# Patient Record
Sex: Female | Born: 2000 | Race: White | Hispanic: No | Marital: Single | Smoking: Never smoker
Health system: Southern US, Community
[De-identification: ages and names within clinical notes are randomized; demographics above are authoritative.]

## PROBLEM LIST (undated history)

## (undated) DIAGNOSIS — R569 Unspecified convulsions: Secondary | ICD-10-CM

## (undated) DIAGNOSIS — G43909 Migraine, unspecified, not intractable, without status migrainosus: Secondary | ICD-10-CM

---
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
## 2021-08-24 ENCOUNTER — Encounter (HOSPITAL_BASED_OUTPATIENT_CLINIC_OR_DEPARTMENT_OTHER): Payer: Self-pay | Admitting: Emergency Medicine

## 2021-08-24 ENCOUNTER — Emergency Department (HOSPITAL_BASED_OUTPATIENT_CLINIC_OR_DEPARTMENT_OTHER)
Admission: EM | Admit: 2021-08-24 | Discharge: 2021-08-24 | Disposition: A | Discharge status: Home / Self Care | Payer: 59 | Attending: Emergency Medicine | Admitting: Emergency Medicine

## 2021-08-24 ENCOUNTER — Other Ambulatory Visit: Payer: Self-pay

## 2021-08-24 DIAGNOSIS — R5383 Other fatigue: Secondary | ICD-10-CM | POA: Diagnosis not present

## 2021-08-24 DIAGNOSIS — R519 Headache, unspecified: Secondary | ICD-10-CM | POA: Insufficient documentation

## 2021-08-24 DIAGNOSIS — Z8669 Personal history of other diseases of the nervous system and sense organs: Secondary | ICD-10-CM | POA: Diagnosis not present

## 2021-08-24 DIAGNOSIS — M791 Myalgia, unspecified site: Secondary | ICD-10-CM | POA: Insufficient documentation

## 2021-08-24 LAB — CBC WITH DIFFERENTIAL/PLATELET
Abs Immature Granulocytes: 0.01 10*3/uL (ref 0.00–0.07)
Basophils Absolute: 0 10*3/uL (ref 0.0–0.1)
Basophils Relative: 0 %
Eosinophils Absolute: 0.1 10*3/uL (ref 0.0–0.5)
Eosinophils Relative: 1 %
HCT: 39.9 % (ref 36.0–46.0)
Hemoglobin: 13.7 g/dL (ref 12.0–15.0)
Immature Granulocytes: 0 %
Lymphocytes Relative: 29 %
Lymphs Abs: 2 10*3/uL (ref 0.7–4.0)
MCH: 31.4 pg (ref 26.0–34.0)
MCHC: 34.3 g/dL (ref 30.0–36.0)
MCV: 91.3 fL (ref 80.0–100.0)
Monocytes Absolute: 0.6 10*3/uL (ref 0.1–1.0)
Monocytes Relative: 9 %
Neutro Abs: 4 10*3/uL (ref 1.7–7.7)
Neutrophils Relative %: 61 %
Platelets: 220 10*3/uL (ref 150–400)
RBC: 4.37 MIL/uL (ref 3.87–5.11)
RDW: 11.9 % (ref 11.5–15.5)
WBC: 6.7 10*3/uL (ref 4.0–10.5)
nRBC: 0 % (ref 0.0–0.2)

## 2021-08-24 LAB — COMPREHENSIVE METABOLIC PANEL
ALT: 26 U/L (ref 0–44)
AST: 26 U/L (ref 15–41)
Albumin: 4.2 g/dL (ref 3.5–5.0)
Alkaline Phosphatase: 45 U/L (ref 38–126)
Anion gap: 7 (ref 5–15)
BUN: 19 mg/dL (ref 6–20)
CO2: 26 mmol/L (ref 22–32)
Calcium: 9 mg/dL (ref 8.9–10.3)
Chloride: 106 mmol/L (ref 98–111)
Creatinine, Ser: 0.76 mg/dL (ref 0.44–1.00)
GFR, Estimated: >60 mL/min (ref >60)
Glucose, Bld: 72 mg/dL (ref 70–99)
Potassium: 3.9 mmol/L (ref 3.5–5.1)
Sodium: 139 mmol/L (ref 135–145)
Total Bilirubin: 0.4 mg/dL (ref 0.3–1.2)
Total Protein: 7.5 g/dL (ref 6.5–8.1)

## 2021-08-24 MED ORDER — SODIUM CHLORIDE 0.9 % IV BOLUS
1000.0000 mL | Freq: Once | INTRAVENOUS | Status: AC
  Administered 2021-08-24: 1000 mL via INTRAVENOUS

## 2021-08-24 MED ORDER — METOCLOPRAMIDE HCL 5 MG/ML IJ SOLN
10.0000 mg | Freq: Once | INTRAMUSCULAR | Status: AC
  Administered 2021-08-24: 10 mg via INTRAVENOUS
  Filled 2021-08-24: qty 2

## 2021-08-24 MED ORDER — DIPHENHYDRAMINE HCL 50 MG/ML IJ SOLN
25.0000 mg | Freq: Once | INTRAMUSCULAR | Status: AC
  Administered 2021-08-24: 25 mg via INTRAVENOUS
  Filled 2021-08-24: qty 1

## 2021-08-24 MED ORDER — DEXAMETHASONE SODIUM PHOSPHATE 10 MG/ML IJ SOLN
10.0000 mg | Freq: Once | INTRAMUSCULAR | Status: AC
  Administered 2021-08-24: 10 mg via INTRAVENOUS
  Filled 2021-08-24: qty 1

## 2021-08-24 NOTE — Discharge Instructions (Signed)
Please read and follow all provided instructions.  Your diagnoses today include:  1. Acute nonintractable headache, unspecified headache type   2. Fatigue, unspecified type     Tests performed today include: Blood cell counts (white, red, and platelets) Electrolytes  Kidney function test Vital signs. See below for your results today.   Medications:  In the Emergency Department you received: Reglan - antinausea/headache medication Benadryl - antihistamine to counteract potential side effects of reglan Decadron - steroid which may help prevent recurrent headache  Take any prescribed medications only as directed.  Additional information:  Follow any educational materials contained in this packet.  You are having a headache. No specific cause was found today for your headache. It may have been a migraine or other cause of headache. Stress, anxiety, fatigue, and depression are common triggers for headaches.   Your headache today does not appear to be life-threatening or require hospitalization, but often the exact cause of headaches is not determined in the emergency department. Therefore, follow-up with your doctor is very important to find out what may have caused your headache and whether or not you need any further diagnostic testing or treatment.   Sometimes headaches can appear benign (not harmful), but then more serious symptoms can develop which should prompt an immediate re-evaluation by your doctor or the emergency department.  BE VERY CAREFUL not to take multiple medicines containing Tylenol (also called acetaminophen). Doing so can lead to an overdose which can damage your liver and cause liver failure and possibly death.   Follow-up instructions: Please follow-up with your primary care provider in the next 3 days for further evaluation of your symptoms.   Return instructions:  Please return to the Emergency Department if you experience worsening symptoms. Return if the  medications do not resolve your headache, if it recurs, or if you have multiple episodes of vomiting or cannot keep down fluids. Return if you have a change from the usual headache. RETURN IMMEDIATELY IF you: Develop a sudden, severe headache Develop confusion or become poorly responsive or faint Develop a fever above 100.81F or problem breathing Have a change in speech, vision, swallowing, or understanding Develop new weakness, numbness, tingling, incoordination in your arms or legs Have a seizure Please return if you have any other emergent concerns.  Additional Information:  Your vital signs today were: BP 127/74   Pulse 69   Temp 98.9 F (37.2 C) (Oral)   Resp 16   Ht 5\' 4"  (1.626 m)   Wt 56.7 kg   LMP 08/13/2021 (Exact Date)   SpO2 100%   BMI 21.46 kg/m  If your blood pressure (BP) was elevated above 135/85 this visit, please have this repeated by your doctor within one month. --------------

## 2021-08-24 NOTE — ED Triage Notes (Signed)
Headache x 1 week. Hx migraine Denies nv , no further neuro deficit

## 2021-08-24 NOTE — ED Provider Notes (Signed)
[facility] EMERGENCY DEPARTMENT Provider Note   CSN: [id number] Arrival date & time: 08/24/21  1221     History Chief Complaint  Patient presents with   Headache    Brandy Johnson is a 20 y.o. female.  Patient with history of migraine headaches with aura presents to the emergency department today for evaluation of headache.  She has been having generalized headache, similar to a tension headache, over the past 1 week.  She had some associated body aches at onset and took a COVID test which was negative.  She denies fever, URI symptoms, neck pain, confusion, weakness in the arms or the legs.  No chest pain, shortness of breath, cough, abdominal pain.  No vomiting or urinary symptoms.  She was seen in an urgent care and was given a shot of Benadryl and Toradol which helped for about a day but then the headache returned. The onset of this condition was acute. The course is constant. Aggravating factors: none. Alleviating factors: none.        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     Family History  Problem Relation Age of Onset   Other Father        Prurigo Nodularis   Cancer Paternal Grandfather        Died at 54   Migraines Other    Hypertension Other    Diabetes Other     Social History   Tobacco Use   Smoking status: Never   Smokeless tobacco: Never  Substance Use Topics   Alcohol use: No   Drug use: No    Home Medications Prior to Admission medications   Medication Sig Start Date End Date Taking? Authorizing Provider  ibuprofen  (ADVIL,MOTRIN) 800 MG tablet Take 1 tablet (800 mg total) by mouth every 8 (eight) hours as needed. 09/15/17   Lawyer, Cristal Deer, PA-C    Allergies    Patient has no active allergies.  Review of Systems   Review of Systems  Constitutional:  Negative for fever.  HENT:  Negative for congestion, dental problem, rhinorrhea and sinus pressure.   Eyes:  Negative for photophobia, discharge, redness and visual disturbance.  Respiratory:  Negative for shortness of breath.   Cardiovascular:  Negative for chest pain.  Gastrointestinal:  Negative for nausea and vomiting.  Musculoskeletal:  Negative for gait problem, myalgias (resolved), neck pain and neck stiffness.  Skin:  Negative for rash.  Neurological:  Positive for headaches. Negative for syncope, speech difficulty, weakness, light-headedness and numbness.  Psychiatric/Behavioral:  Negative for confusion.    Physical Exam Updated Vital Signs BP 124/74   Pulse 87   Temp 98.9 F (37.2 C) (Oral)   Resp 16   Ht 5\' 4"  (1.626 m)   Wt 56.7 kg   LMP 08/13/2021 (Exact Date)   SpO2 100%   BMI 21.46 kg/m   Physical Exam Vitals and nursing note reviewed.  Constitutional:      Appearance: She is well-developed.  HENT:     Head: Normocephalic and atraumatic.  Right Ear: Tympanic membrane, ear canal and external ear normal.     Left Ear: Tympanic membrane, ear canal and external ear normal.     Nose: Nose normal.     Mouth/Throat:     Mouth: Mucous membranes are moist.     Pharynx: Uvula midline.  Eyes:     General: Lids are normal.     Extraocular Movements:     Right eye: No nystagmus.     Left eye: No nystagmus.     Conjunctiva/sclera: Conjunctivae normal.     Pupils: Pupils are equal, round, and reactive to light.     Right eye: Pupil is round and reactive.     Left eye: Pupil is round and reactive.  Cardiovascular:     Rate and Rhythm: Normal rate and regular rhythm.  Pulmonary:     Effort: Pulmonary effort is normal.      Breath sounds: Normal breath sounds.  Abdominal:     Palpations: Abdomen is soft.     Tenderness: There is no abdominal tenderness.  Musculoskeletal:     Cervical back: Normal range of motion and neck supple. No tenderness or bony tenderness.  Skin:    General: Skin is warm and dry.  Neurological:     Mental Status: She is alert and oriented to person, place, and time.     GCS: GCS eye subscore is 4. GCS verbal subscore is 5. GCS motor subscore is 6.     Cranial Nerves: No cranial nerve deficit.     Sensory: No sensory deficit.     Motor: No weakness.     Coordination: Coordination normal.     Gait: Gait normal.    ED Results / Procedures / Treatments   Labs (all labs ordered are listed, but only abnormal results are displayed) Labs Reviewed  CBC WITH DIFFERENTIAL/PLATELET  COMPREHENSIVE METABOLIC PANEL    EKG None  Radiology No results found.  Procedures Procedures   Medications Ordered in ED Medications  sodium chloride 0.9 % bolus 1,000 mL (has no administration in time range)  metoCLOPramide (REGLAN) injection 10 mg (has no administration in time range)  diphenhydrAMINE (BENADRYL) injection 25 mg (has no administration in time range)  dexamethasone (DECADRON) injection 10 mg (has no administration in time range)    ED Course  I have reviewed the triage vital signs and the nursing notes.  Pertinent labs & imaging results that were available during my care of the patient were reviewed by me and considered in my medical decision making (see chart for details).  Patient seen and examined. Work-up initiated. Medications ordered.   Will give migraine cocktail.  Will check CBC and CMP due to recent body aches, ongoing headaches.  At this point, no concern for a meningitis.  No head trauma or other indications which would require CT imaging of the head.  Discussed with patient.  She agrees.  Family here to drive her home.  Vital signs reviewed and are as follows: BP  124/74   Pulse 87   Temp 98.9 F (37.2 C) (Oral)   Resp 16   Ht 5\' 4"  (1.626 m)   Wt 56.7 kg   LMP 08/13/2021 (Exact Date)   SpO2 100%   BMI 21.46 kg/m   2:32 PM patient reassessed, headache is resolved.  We discussed labs which are reassuring.  Encouraged to rest at home.  Patient counseled to return if they have weakness in their arms or legs, slurred speech, trouble walking or  talking, confusion, trouble with their balance, or if they have any other concerns. Patient verbalizes understanding and agrees with plan.      MDM Rules/Calculators/A&P                           Patient with headache over the past week.  Question of viral syndrome with fatigue and body aches.  Patient without high-risk features of headache including: sudden onset/thunderclap HA, no similar headache in past, altered mental status, accompanying seizure, headache with exertion, age > 29, history of immunocompromise, neck or shoulder pain, fever, use of anticoagulation, family history of spontaneous SAH, concomitant drug use, toxic exposure.   Patient has a normal complete neurological exam, normal vital signs, normal level of consciousness, no signs of meningismus, is well-appearing/non-toxic appearing, no signs of trauma.   Imaging with CT/MRI not indicated given history and physical exam findings.  Labs ordered due to fatigue, persistent symptoms, reassuring.  No dangerous or life-threatening conditions suspected or identified by history, physical exam, and by work-up. No indications for hospitalization identified.     Final Clinical Impression(s) / ED Diagnoses Final diagnoses:  Acute nonintractable headache, unspecified headache type  Fatigue, unspecified type    Rx / DC Orders ED Discharge Orders     None        Renne Crigler, PA-C 08/24/21 1434    Curatolo, Adam, DO 08/24/21 1501

## 2021-09-27 ENCOUNTER — Emergency Department (HOSPITAL_BASED_OUTPATIENT_CLINIC_OR_DEPARTMENT_OTHER)
Admission: EM | Admit: 2021-09-27 | Discharge: 2021-09-27 | Disposition: A | Discharge status: Home / Self Care | Payer: 59 | Attending: Emergency Medicine | Admitting: Emergency Medicine

## 2021-09-27 ENCOUNTER — Other Ambulatory Visit: Payer: Self-pay

## 2021-09-27 ENCOUNTER — Emergency Department (HOSPITAL_BASED_OUTPATIENT_CLINIC_OR_DEPARTMENT_OTHER): Payer: 59

## 2021-09-27 ENCOUNTER — Encounter (HOSPITAL_BASED_OUTPATIENT_CLINIC_OR_DEPARTMENT_OTHER): Payer: Self-pay | Admitting: Emergency Medicine

## 2021-09-27 DIAGNOSIS — G43809 Other migraine, not intractable, without status migrainosus: Secondary | ICD-10-CM | POA: Diagnosis not present

## 2021-09-27 DIAGNOSIS — R519 Headache, unspecified: Secondary | ICD-10-CM | POA: Diagnosis present

## 2021-09-27 LAB — COMPREHENSIVE METABOLIC PANEL
ALT: 37 U/L (ref 0–44)
AST: 31 U/L (ref 15–41)
Albumin: 4.9 g/dL (ref 3.5–5.0)
Alkaline Phosphatase: 53 U/L (ref 38–126)
Anion gap: 8 (ref 5–15)
BUN: 15 mg/dL (ref 6–20)
CO2: 26 mmol/L (ref 22–32)
Calcium: 9.5 mg/dL (ref 8.9–10.3)
Chloride: 103 mmol/L (ref 98–111)
Creatinine, Ser: 0.71 mg/dL (ref 0.44–1.00)
GFR, Estimated: >60 mL/min (ref >60)
Glucose, Bld: 124 mg/dL — ABNORMAL HIGH (ref 70–99)
Potassium: 3.7 mmol/L (ref 3.5–5.1)
Sodium: 137 mmol/L (ref 135–145)
Total Bilirubin: 0.3 mg/dL (ref 0.3–1.2)
Total Protein: 8.4 g/dL — ABNORMAL HIGH (ref 6.5–8.1)

## 2021-09-27 LAB — CBC WITH DIFFERENTIAL/PLATELET
Abs Immature Granulocytes: 0.03 10*3/uL (ref 0.00–0.07)
Basophils Absolute: 0 10*3/uL (ref 0.0–0.1)
Basophils Relative: 0 %
Eosinophils Absolute: 0 10*3/uL (ref 0.0–0.5)
Eosinophils Relative: 0 %
HCT: 40.2 % (ref 36.0–46.0)
Hemoglobin: 13.9 g/dL (ref 12.0–15.0)
Immature Granulocytes: 0 %
Lymphocytes Relative: 13 %
Lymphs Abs: 1.4 10*3/uL (ref 0.7–4.0)
MCH: 31.7 pg (ref 26.0–34.0)
MCHC: 34.6 g/dL (ref 30.0–36.0)
MCV: 91.6 fL (ref 80.0–100.0)
Monocytes Absolute: 0.3 10*3/uL (ref 0.1–1.0)
Monocytes Relative: 3 %
Neutro Abs: 9.5 10*3/uL — ABNORMAL HIGH (ref 1.7–7.7)
Neutrophils Relative %: 84 %
Platelets: 280 10*3/uL (ref 150–400)
RBC: 4.39 MIL/uL (ref 3.87–5.11)
RDW: 12.3 % (ref 11.5–15.5)
WBC: 11.3 10*3/uL — ABNORMAL HIGH (ref 4.0–10.5)
nRBC: 0 % (ref 0.0–0.2)

## 2021-09-27 LAB — PREGNANCY, URINE: Preg Test, Ur: NEGATIVE

## 2021-09-27 MED ORDER — PROCHLORPERAZINE EDISYLATE 10 MG/2ML IJ SOLN: 10.0000 mg | Freq: Once | INTRAMUSCULAR | Status: DC

## 2021-09-27 MED ORDER — DIPHENHYDRAMINE HCL 50 MG/ML IJ SOLN: 12.5000 mg | Freq: Once | INTRAMUSCULAR | Status: DC

## 2021-09-27 MED ORDER — SODIUM CHLORIDE 0.9 % IV BOLUS: 1000.0000 mL | Freq: Once | INTRAVENOUS | Status: DC

## 2021-09-27 NOTE — ED Notes (Signed)
Patient transported to CT 

## 2021-09-27 NOTE — Discharge Instructions (Addendum)
Neurology should call you with an appointment but please call and confirm appointment with them.

## 2021-09-27 NOTE — ED Triage Notes (Addendum)
Pt presents to ED POV. Pt c/o HA and dizziness x2w. Pt reports that she has been seen multiple times for this but the pain is different this time. Reports pain is in back of head and radiates forward intermittently. Taking prednisone and Imitrex at home w/o relief.

## 2021-09-27 NOTE — ED Provider Notes (Signed)
[facility] EMERGENCY DEPARTMENT Provider Note   CSN: [id number] Arrival date & time: 09/27/21  1439     History Chief Complaint  Patient presents with   Headache    Brandy Johnson is a 20 y.o. female.  Patient here with headaches.  History of headaches about 5 years ago but has been without headaches for a long time but now having daily headaches for the last 6 weeks.  History of seizures but no seizures.  Has not been able to follow-up with neurologist.  Has not had any head imaging since you headaches began.  No fevers, no chills.  Patient is here for CT scan.  Headache is overall mild at this time.  They would like referral to neurology.  The history is provided by the patient.  Headache Pain location:  Occipital Quality:  Dull Radiates to:  Does not radiate Onset quality:  Gradual Duration:  6 weeks Timing:  Intermittent Progression:  Waxing and waning Chronicity:  New Similar to prior headaches: no   Relieved by:  Nothing Worsened by:  Nothing Associated symptoms: no abdominal pain, no back pain, no blurred vision, no congestion, no cough, no diarrhea, no dizziness, no drainage, no ear pain, no eye pain, no facial pain, no fatigue, no fever, no focal weakness, no hearing loss, no myalgias, no nausea, no near-syncope, no neck pain, no neck stiffness, no numbness, no paresthesias, no seizures, no sinus pressure, no sore throat, no swollen glands, no syncope, no tingling, no URI, no visual change, no vomiting and no weakness       Past Medical History:  Diagnosis Date   Migraine    Seizures (HCC)     Patient Active Problem List   Diagnosis Date Noted   Migraine with aura 02/16/2014   Migraine variant 02/16/2014   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     Family History  Problem Relation Age of Onset   Other Father        Prurigo Nodularis   Cancer Paternal Grandfather        Died at 9   Migraines Other    Hypertension Other    Diabetes Other     Social History   Tobacco Use   Smoking status: Never   Smokeless tobacco: Never  Substance Use Topics   Alcohol use: No   Drug use: No    Home Medications Prior to Admission medications   Medication Sig Start Date End Date Taking? Authorizing Provider  ibuprofen (ADVIL,MOTRIN) 800 MG tablet Take 1 tablet (800 mg total) by mouth every 8 (eight) hours as needed. 09/15/17   Lawyer, Cristal Deer, PA-C    Allergies    Patient has no known allergies.  Review of Systems   Review of Systems  Constitutional:  Negative for chills, fatigue and fever.  HENT:  Negative for congestion, ear pain, hearing loss, postnasal drip, sinus pressure and sore throat.   Eyes:  Negative for blurred vision, pain and visual disturbance.  Respiratory:  Negative for cough and shortness of breath.   Cardiovascular:  Negative for chest pain, palpitations, syncope and near-syncope.  Gastrointestinal:  Negative for abdominal pain, diarrhea, nausea and vomiting.  Genitourinary:  Negative for dysuria and hematuria.  Musculoskeletal:  Negative for arthralgias, back  pain, myalgias, neck pain and neck stiffness.  Skin:  Negative for color change and rash.  Neurological:  Positive for headaches. Negative for dizziness, focal weakness, seizures, syncope, weakness, numbness and paresthesias.  All other systems reviewed and are negative.  Physical Exam Updated Vital Signs BP 120/74 (BP Location: Right Arm)   Pulse 74   Temp 98.5 F (36.9 C) (Oral)   Resp 18   Ht 5\' 4"  (1.626 m)   Wt 56.2 kg   LMP 09/17/2021   SpO2 100%   BMI 21.28 kg/m   Physical Exam Vitals and nursing note reviewed.  Constitutional:      General: She is not in acute distress.    Appearance: She is well-developed.   HENT:     Head: Normocephalic and atraumatic.  Eyes:     General: No scleral icterus.    Extraocular Movements: Extraocular movements intact.     Right eye: Normal extraocular motion.     Left eye: Normal extraocular motion.     Conjunctiva/sclera: Conjunctivae normal.     Pupils: Pupils are equal, round, and reactive to light. Pupils are equal.  Neck:     Meningeal: Brudzinski's sign and Kernig's sign absent.  Cardiovascular:     Rate and Rhythm: Normal rate and regular rhythm.     Heart sounds: No murmur heard. Pulmonary:     Effort: Pulmonary effort is normal. No respiratory distress.     Breath sounds: Normal breath sounds.  Abdominal:     Palpations: Abdomen is soft.     Tenderness: There is no abdominal tenderness.  Musculoskeletal:     Cervical back: Normal range of motion and neck supple. No rigidity.  Lymphadenopathy:     Cervical: No cervical adenopathy.  Skin:    General: Skin is warm and dry.     Capillary Refill: Capillary refill takes less than 2 seconds.  Neurological:     Mental Status: She is alert and oriented to person, place, and time.     Cranial Nerves: No cranial nerve deficit or dysarthria.     Sensory: No sensory deficit.     Motor: No weakness.     Coordination: Coordination normal.  Psychiatric:        Mood and Affect: Mood normal.    ED Results / Procedures / Treatments   Labs (all labs ordered are listed, but only abnormal results are displayed) Labs Reviewed  CBC WITH DIFFERENTIAL/PLATELET - Abnormal; Notable for the following components:      Result Value   WBC 11.3 (*)    Neutro Abs 9.5 (*)    All other components within normal limits  COMPREHENSIVE METABOLIC PANEL - Abnormal; Notable for the following components:   Glucose, Bld 124 (*)    Total Protein 8.4 (*)    All other components within normal limits  PREGNANCY, URINE    EKG None  Radiology CT Head Wo Contrast  Result Date: 09/27/2021 CLINICAL DATA:  Headache and  dizziness. EXAM: CT HEAD WITHOUT CONTRAST TECHNIQUE: Contiguous axial images were obtained from the base of the skull through the vertex without intravenous contrast. COMPARISON:  Report from head CT March 04, 2008 however no corresponding imaging available at time dictation. FINDINGS: Brain: No evidence of acute infarction, hemorrhage, hydrocephalus, extra-axial collection or mass lesion/mass effect. Vascular: No hyperdense vessel or unexpected calcification. Skull: Normal. Negative for fracture or focal lesion. Sinuses/Orbits: Visualized portions of the paranasal sinuses and mastoid air cells are predominantly clear. Other: None IMPRESSION: No acute  intracranial findings. Electronically Signed   By: Maudry Mayhew M.D.   On: 09/27/2021 18:20    Procedures Procedures   Medications Ordered in ED Medications - No data to display  ED Course  I have reviewed the triage vital signs and the nursing notes.  Pertinent labs & imaging results that were available during my care of the patient were reviewed by me and considered in my medical decision making (see chart for details).    MDM Rules/Calculators/A&P                           Brandy Johnson is here for headaches.  History of headaches as a teenager but has been without headaches for several years.  New different type of migraine for the last 6 weeks.  Has been mostly daily.  Has been treated with prednisone and Imitrex with some relief.  Neuro exam is normal.  No fever, no neck pain, no neck stiffness.  No concern for meningitis.  Patient here for CT scan and neurology referral as this has been difficult to get done outpatient.  Given new and different headache for the last 6 weeks I think that it is reasonable to get a head CT.  We will refer her to neurology.  Head CT is unremarkable.  Lab work unremarkable.  We will refer her to neurology.  She felt comfortable at this time and did not want any headache medicine.  She is finishing a  prednisone taper at this time as well.  Discharged in good condition.  This chart was dictated using voice recognition software.  Despite best efforts to proofread,  errors can occur which can change the documentation meaning. \  Final Clinical Impression(s) / ED Diagnoses Final diagnoses:  Other migraine without status migrainosus, not intractable    Rx / DC Orders ED Discharge Orders          Ordered    Ambulatory referral to Neurology       Comments: An appointment is requested in approximately: 1 week   09/27/21 1853             Virgina Norfolk, DO 09/27/21 1857

## 2021-09-29 ENCOUNTER — Emergency Department (HOSPITAL_COMMUNITY)
Admission: EM | Admit: 2021-09-29 | Discharge: 2021-09-29 | Disposition: A | Discharge status: Home / Self Care | Payer: 59 | Attending: Emergency Medicine | Admitting: Emergency Medicine

## 2021-09-29 DIAGNOSIS — Z5321 Procedure and treatment not carried out due to patient leaving prior to being seen by health care provider: Secondary | ICD-10-CM | POA: Diagnosis not present

## 2021-09-29 DIAGNOSIS — R519 Headache, unspecified: Secondary | ICD-10-CM | POA: Diagnosis present

## 2021-10-03 ENCOUNTER — Other Ambulatory Visit: Payer: Self-pay

## 2021-10-03 ENCOUNTER — Telehealth: Payer: Self-pay | Admitting: *Deleted

## 2021-10-03 ENCOUNTER — Encounter: Payer: Self-pay | Admitting: Neurology

## 2021-10-03 ENCOUNTER — Ambulatory Visit (INDEPENDENT_AMBULATORY_CARE_PROVIDER_SITE_OTHER): Payer: 59 | Admitting: Neurology

## 2021-10-03 VITALS — BP 114/65 | HR 86 | Ht 64.0 in | Wt 124.8 lb

## 2021-10-03 DIAGNOSIS — M542 Cervicalgia: Secondary | ICD-10-CM

## 2021-10-03 DIAGNOSIS — M5481 Occipital neuralgia: Secondary | ICD-10-CM | POA: Diagnosis not present

## 2021-10-03 DIAGNOSIS — G43109 Migraine with aura, not intractable, without status migrainosus: Secondary | ICD-10-CM | POA: Diagnosis not present

## 2021-10-03 DIAGNOSIS — M7918 Myalgia, other site: Secondary | ICD-10-CM

## 2021-10-03 MED ORDER — CYCLOBENZAPRINE HCL 10 MG PO TABS: 5.0000 mg | ORAL_TABLET | Freq: Three times a day (TID) | ORAL | 3 refills | Status: AC | PRN

## 2021-10-03 MED ORDER — ONDANSETRON 4 MG PO TBDP: 4.0000 mg | ORAL_TABLET | Freq: Three times a day (TID) | ORAL | 3 refills | Status: AC | PRN

## 2021-10-03 MED ORDER — RIZATRIPTAN BENZOATE 10 MG PO TBDP: 10.0000 mg | ORAL_TABLET | ORAL | 11 refills | Status: AC | PRN

## 2021-10-03 NOTE — Telephone Encounter (Signed)
Spoke with patient per Dr Lucia Gaskins. Pt reported her massage went well and right now she is feeling good. She will do whatever Dr Lucia Gaskins advises. She mentioned the masseuse told her she was one of the tensest people she has ever seen. Pt verbalized appreciation for the call.

## 2021-10-03 NOTE — Patient Instructions (Addendum)
Nerve blocks - occipital nerve blocks Rizatriptan: Please take one tablet at the onset of your headache. If it does not improve the symptoms please take one additional tablet. Do not take more then 2 tablets in 24hrs. Do not take use more then 2 to 3 times in a week. Can take with Ondansetron. Ondansetron: Take with Rizatripran can just take for nausea Karin Golden - [email or web address] - dry needling Flexeril 5mg  - 10mg  at bedtime "Occipital nerve blcoks Blumfeld"  Cyclobenzaprine Tablets What is this medication? CYCLOBENZAPRINE (sye kloe BEN za preen) treats muscle spasms. It works by relaxing your muscles, which reduces muscle stiffness. It belongs to a group of medications called muscle relaxants. This medicine may be used for other purposes; ask your health care provider or pharmacist if you have questions. COMMON BRAND NAME(S): Fexmid, Flexeril What should I tell my care team before I take this medication? They need to know if you have any of these conditions: Heart disease, irregular heartbeat, or previous heart attack Liver disease Thyroid problem An unusual or allergic reaction to cyclobenzaprine, tricyclic antidepressants, lactose, other medications, foods, dyes, or preservatives Pregnant or trying to get pregnant Breast-feeding How should I use this medication? Take this medication by mouth with a glass of water. Follow the directions on the prescription label. If this medication upsets your stomach, take it with food or milk. Take your medication at regular intervals. Do not take it more often than directed. Talk to your care team about the use of this medication in children. Special care may be needed. Overdosage: If you think you have taken too much of this medicine contact a poison control center or emergency room at once. NOTE: This medicine is only for you. Do not share this medicine with others. What if I miss a dose? If you miss a dose, take it as soon as you can. If it is almost time  for your next dose, take only that dose. Do not take double or extra doses. What may interact with this medication? Do not take this medication with any of the following: MAOIs like Carbex, Eldepryl, Marplan, Nardil, and Parnate Narcotic medications for cough Safinamide This medication may also interact with the following: Alcohol Bupropion Antihistamines for allergy, cough and cold Certain medications for anxiety or sleep Certain medications for bladder problems like oxybutynin, tolterodine Certain medications for depression like amitriptyline, fluoxetine, sertraline Certain medications for Parkinson's disease like benztropine, trihexyphenidyl Certain medications for seizures like phenobarbital, primidone Certain medications for stomach problems like dicyclomine, hyoscyamine Certain medications for travel sickness like scopolamine General anesthetics like halothane, isoflurane, methoxyflurane, propofol Ipratropium Local anesthetics like lidocaine, pramoxine, tetracaine Medications that relax muscles for surgery Narcotic medications for pain Phenothiazines like chlorpromazine, mesoridazine, prochlorperazine, thioridazine Verapamil This list may not describe all possible interactions. Give your health care provider a list of all the medicines, herbs, non-prescription drugs, or dietary supplements you use. Also tell them if you smoke, drink alcohol, or use illegal drugs. Some items may interact with your medicine. What should I watch for while using this medication? Tell your care team if your symptoms do not start to get better or if they get worse. You may get drowsy or dizzy. Do not drive, use machinery, or do anything that needs mental alertness until you know how this medication affects you. Do not stand or sit up quickly, especially if you are an older patient. This reduces the risk of dizzy or fainting spells. Alcohol may interfere with the effect of this medication. Avoid  alcoholic  drinks. If you are taking another medication that also causes drowsiness, you may have more side effects. Give your care team a list of all medications you use. Your care team will tell you how much medication to take. Do not take more medication than directed. Call emergency for help if you have problems breathing or unusual sleepiness. Your mouth may get dry. Chewing sugarless gum or sucking hard candy, and drinking plenty of water may help. Contact your care team if the problem does not go away or is severe. What side effects may I notice from receiving this medication? Side effects that you should report to your care team as soon as possible: Allergic reactions-skin rash, itching, hives, swelling of the face, lips, tongue, or throat CNS depression-slow or shallow breathing, shortness of breath, feeling faint, dizziness, confusion, trouble staying awake Heart rhythm changes-fast or irregular heartbeat, dizziness, feeling faint or lightheaded, chest pain, trouble breathing Side effects that usually do not require medical attention (report to your care team if they continue or are bothersome): Constipation Dizziness Drowsiness Dry mouth Fatigue Nausea This list may not describe all possible side effects. Call your doctor for medical advice about side effects. You may report side effects to FDA at 1-800-FDA-1088. Where should I keep my medication? Keep out of the reach of children. Store at room temperature between 15 and 30 degrees C (59 and 86 degrees F). Keep container tightly closed. Throw away any unused medication after the expiration date. NOTE: This sheet is a summary. It may not cover all possible information. If you have questions about this medicine, talk to your doctor, pharmacist, or health care provider.  2022 Elsevier/Gold Standard (2021-03-14 09:41:19) Ondansetron Dissolving Tablets What is this medication? ONDANSETRON (on DAN se tron) prevents nausea and vomiting from  chemotherapy, radiation, or surgery. It works by blocking substances in the body that may cause nausea or vomiting. It belongs to a group of medications called antiemetics. This medicine may be used for other purposes; ask your health care provider or pharmacist if you have questions. COMMON BRAND NAME(S): Zofran ODT What should I tell my care team before I take this medication? They need to know if you have any of these conditions: Heart disease History of irregular heartbeat Liver disease Low levels of magnesium or potassium in the blood An unusual or allergic reaction to ondansetron, granisetron, other medications, foods, dyes, or preservatives Pregnant or trying to get pregnant Breast-feeding How should I use this medication? These tablets are made to dissolve in the mouth. Do not try to push the tablet through the foil backing. With dry hands, peel away the foil backing and gently remove the tablet. Place the tablet in the mouth and allow it to dissolve, then swallow. While you may take these tablets with water, it is not necessary to do so. Talk to your care team regarding the use of this medication in children. Special care may be needed. Overdosage: If you think you have taken too much of this medicine contact a poison control center or emergency room at once. NOTE: This medicine is only for you. Do not share this medicine with others. What if I miss a dose? If you miss a dose, take it as soon as you can. If it is almost time for your next dose, take only that dose. Do not take double or extra doses. What may interact with this medication? Do not take this medication with any of the following: Apomorphine Certain medications for fungal  infections like fluconazole, itraconazole, ketoconazole, posaconazole, voriconazole Cisapride Dronedarone Pimozide Thioridazine This medication may also interact with the following: Carbamazepine Certain medications for depression, anxiety, or  psychotic disturbances Fentanyl Linezolid MAOIs like Carbex, Eldepryl, Marplan, Nardil, and Parnate Methylene blue (injected into a vein) Other medications that prolong the QT interval (cause an abnormal heart rhythm) like dofetilide, ziprasidone Phenytoin Rifampicin Tramadol This list may not describe all possible interactions. Give your health care provider a list of all the medicines, herbs, non-prescription drugs, or dietary supplements you use. Also tell them if you smoke, drink alcohol, or use illegal drugs. Some items may interact with your medicine. What should I watch for while using this medication? Check with your care team as soon as you can if you have any sign of an allergic reaction. What side effects may I notice from receiving this medication? Side effects that you should report to your care team as soon as possible: Allergic reactions-skin rash, itching, hives, swelling of the face, lips, tongue, or throat Bowel blockage-stomach cramping, unable to have a bowel movement or pass gas, loss of appetite, vomiting Chest pain (angina)-pain, pressure, or tightness in the chest, neck, back, or arms Heart rhythm changes-fast or irregular heartbeat, dizziness, feeling faint or lightheaded, chest pain, trouble breathing Irritability, confusion, fast or irregular heartbeat, muscle stiffness, twitching muscles, sweating, high fever, seizure, chills, vomiting, diarrhea, which may be signs of serotonin syndrome Side effects that usually do not require medical attention (report to your care team if they continue or are bothersome): Constipation Diarrhea General discomfort and fatigue Headache This list may not describe all possible side effects. Call your doctor for medical advice about side effects. You may report side effects to FDA at 1-800-FDA-1088. Where should I keep my medication? Keep out of the reach of children and pets. Store between 2 and 30 degrees C (36 and 86 degrees F).  Throw away any unused medication after the expiration date. NOTE: This sheet is a summary. It may not cover all possible information. If you have questions about this medicine, talk to your doctor, pharmacist, or health care provider.  2022 Elsevier/Gold Standard (2020-12-21 14:39:19) Rizatriptan Disintegrating Tablets What is this medication? RIZATRIPTAN (rye za TRIP tan) treats migraines. It works by blocking pain signals and narrowing blood vessels in the brain. It belongs to a group of medications called triptans. It is not used to prevent migraines. This medicine may be used for other purposes; ask your health care provider or pharmacist if you have questions. COMMON BRAND NAME(S): Maxalt-MLT What should I tell my care team before I take this medication? They need to know if you have any of these conditions: Cigarette smoker Circulation problems in fingers and toes Diabetes Heart disease High blood pressure High cholesterol History of irregular heartbeat History of stroke Kidney disease Liver disease Stomach or intestine problems An unusual or allergic reaction to rizatriptan, other medications, foods, dyes, or preservatives Pregnant or trying to get pregnant Breast-feeding How should I use this medication? Take this medication by mouth. Follow the directions on the prescription label. Leave the tablet in the sealed blister pack until you are ready to take it. With dry hands, open the blister and gently remove the tablet. If the tablet breaks or crumbles, throw it away and take a new tablet out of the blister pack. Place the tablet in the mouth and allow it to dissolve, and then swallow. Do not cut, crush, or chew this medication. You do not need water to take  this medication. Do not take it more often than directed. Talk to your care team regarding the use of this medication in children. While this medication may be prescribed for children as young as 6 years for selected conditions,  precautions do apply. Overdosage: If you think you have taken too much of this medicine contact a poison control center or emergency room at once. NOTE: This medicine is only for you. Do not share this medicine with others. What if I miss a dose? This does not apply. This medication is not for regular use. What may interact with this medication? Do not take this medication with any of the following medications: Certain medications for migraine headache like almotriptan, eletriptan, frovatriptan, naratriptan, rizatriptan, sumatriptan, zolmitriptan Ergot alkaloids like dihydroergotamine, ergonovine, ergotamine, methylergonovine MAOIs like Carbex, Eldepryl, Marplan, Nardil, and Parnate This medication may also interact with the following medications: Certain medications for depression, anxiety, or psychotic disorders Propranolol This list may not describe all possible interactions. Give your health care provider a list of all the medicines, herbs, non-prescription drugs, or dietary supplements you use. Also tell them if you smoke, drink alcohol, or use illegal drugs. Some items may interact with your medicine. What should I watch for while using this medication? Visit your care team for regular checks on your progress. Tell your care team if your symptoms do not start to get better or if they get worse. You may get drowsy or dizzy. Do not drive, use machinery, or do anything that needs mental alertness until you know how this medication affects you. Do not stand up or sit up quickly, especially if you are an older patient. This reduces the risk of dizzy or fainting spells. Alcohol may interfere with the effect of this medication. Your mouth may get dry. Chewing sugarless gum or sucking hard candy and drinking plenty of water may help. Contact your care team if the problem does not go away or is severe. If you take migraine medications for 10 or more days a month, your migraines may get worse. Keep a  diary of headache days and medication use. Contact your care team if your migraine attacks occur more frequently. What side effects may I notice from receiving this medication? Side effects that you should report to your care team as soon as possible: Allergic reactions-skin rash, itching, hives, swelling of the face, lips, tongue, or throat Burning, pain, tingling, or color changes in the legs or feet Heart attack-pain or tightness in the chest, shoulders, arms, or jaw, nausea, shortness of breath, cold or clammy skin, feeling faint or lightheaded Heart rhythm changes-fast or irregular heartbeat, dizziness, feeling faint or lightheaded, chest pain, trouble breathing Increase in blood pressure Irritability, confusion, fast or irregular heartbeat, muscle stiffness, twitching muscles, sweating, high fever, seizure, chills, vomiting, diarrhea, which may be signs of serotonin syndrome Raynaud's-cool, numb, or painful fingers or toes that may change color from pale, to blue, to red Seizures Stroke-sudden numbness or weakness of the face, arm, or leg, trouble speaking, confusion, trouble walking, loss of balance or coordination, dizziness, severe headache, change in vision Sudden or severe stomach pain, nausea, vomiting, fever, or bloody diarrhea Vision loss Side effects that usually do not require medical attention (report to your care team if they continue or are bothersome): Dizziness General discomfort or fatigue This list may not describe all possible side effects. Call your doctor for medical advice about side effects. You may report side effects to FDA at 1-800-FDA-1088. Where should I keep my  medication? Keep out of the reach of children and pets. Store at room temperature between 15 and 30 degrees C (59 and 86 degrees F). Protect from light and moisture. Throw away any unused medication after the expiration date. NOTE: This sheet is a summary. It may not cover all possible information. If you  have questions about this medicine, talk to your doctor, pharmacist, or health care provider.  2022 Elsevier/Gold Standard (2020-12-26 16:19:19)  Occipital Neuralgia Occipital neuralgia is a type of headache that causes brief episodes of very bad pain in the back of the head. Pain from occipital neuralgia may spread (radiate) to other parts of the head. These headaches may be caused by irritation of the nerves that leave the spinal cord high up in the neck, just below the base of the skull (occipital nerves). The occipital nerves transmit sensations from the back of the head, the top of the head, and the areas behind the ears. What are the causes? This condition can occur without any known cause (primary headache syndrome). In other cases, this condition is caused by pressure on or irritation of one of the two occipital nerves. Pressure and irritation may be due to: Muscle spasm in the neck. Neck injury. Wear and tear of the vertebrae in the neck (osteoarthritis). Disease of the disks that separate the vertebrae. Swollen blood vessels that put pressure on the occipital nerves. Infections. Tumors. Diabetes. What are the signs or symptoms? This condition causes brief burning, stabbing, electric, shocking, or shooting pain in the back of the head that can radiate to the top of the head. It can happen on one side or both sides of the head. It can also cause: Pain behind the eye. Pain triggered by neck movement or hair brushing. Scalp tenderness. Aching in the back of the head between episodes of very bad pain. Pain that gets worse with exposure to bright lights. How is this diagnosed? Your health care provider may diagnose the condition based on a physical exam and your symptoms. Tests may be done, such as: Imaging studies of the brain and neck (cervical spine), such as an MRI or CT scan. These look for causes of pinched nerves. Applying pressure to the nerves in the neck to try to re-create  the pain. Injection of numbing medicine into the occipital nerve areas to see if pain goes away (diagnostic nerve block). How is this treated? Treatment for this condition may begin with simple measures, such as: Rest. Massage. Applying heat or cold to the area. Over-the-counter pain relievers. If these measures do not work, you may need other treatments, including: Medicines, such as: Prescription-strength anti-inflammatory medicines. Muscle relaxants. Anti-seizure medicines, which can relieve pain. Antidepressants, which can relieve pain. Injected medicines, such as medicines that numb the area (local anesthetic) and steroids. Pulsed radiofrequency ablation. This is when wires are implanted to deliver electrical impulses that block pain signals from the occipital nerve. Surgery to relieve nerve pressure. Physical therapy. Follow these instructions at home: Managing pain   Avoid any activities that cause pain. Rest when you have an attack of pain. Try gentle massage to relieve pain. Try a different pillow or sleeping position. If directed, apply heat to the affected area as often as told by your health care provider. Use the heat source that your health care provider recommends, such as a moist heat pack or a heating pad. Place a towel between your skin and the heat source. Leave the heat on for 20-30 minutes. Remove the heat  if your skin turns bright red. This is especially important if you are unable to feel pain, heat, or cold. You have a greater risk of getting burned. If directed, put ice on the back of your head and neck area. To do this: Put ice in a plastic bag. Place a towel between your skin and the bag. Leave the ice on for 20 minutes, 2-3 times a day. Remove the ice if your skin turns bright red. This is very important. If you cannot feel pain, heat, or cold, you have a greater risk of damage to the area. General instructions Take over-the-counter and prescription  medicines only as told by your health care provider. Avoid things that make your symptoms worse, such as bright lights. Try to stay active. Get regular exercise that does not cause pain. Ask your health care provider to suggest safe exercises for you. Work with a physical therapist to learn stretching exercises you can do at home. Practice good posture. Keep all follow-up visits. This is important. Contact a health care provider if: Your medicine is not working. You have new or worsening symptoms. Get help right away if: You have very bad head pain that does not go away. You have a sudden change in vision, balance, or speech. These symptoms may represent a serious problem that is an emergency. Do not wait to see if the symptoms will go away. Get medical help right away. Call your local emergency services (911 in the [place] Do not drive yourself to the hospital. Summary Occipital neuralgia is a type of headache that causes brief episodes of very bad pain in the back of the head. Pain from occipital neuralgia may spread (radiate) to other parts of the head. Treatment for this condition includes rest, massage, and medicines. This information is not intended to replace advice given to you by your health care provider. Make sure you discuss any questions you have with your health care provider. Document Revised: 09/30/2020 Document Reviewed: 09/30/2020 Elsevier Patient Education  2022 [employer].

## 2021-10-03 NOTE — Progress Notes (Signed)
[facility]    Provider:  Dr Lucia Gaskins Requesting Provider: Virgina Norfolk, DO Primary Care Provider:  Adolph Pollack, FNP  CC:  migraines  HPI:  Brandy Johnson is a 20 y.o. female here as requested by Virgina Norfolk, DO for migraines. Mother accompanies and also provides information. Migraines started at the age of 13-16 she would have an aura, fingers go numb, lose speech, then throw up and her head would hurt. Once a month. She would go to the ED. She hasn't had an aura since the age of 81. This past labor day weekend she did not feel good, woke up the next morning, she took alleve and it worked, the next day had head pain had a toradol and benadryl shot at the ED, continued for a few days, the next week she thought it may be a migraine and a migraine cocktail didn't help. She ha a muscle relaxer she went to her primary care, dizziness started. The past 3 days. Headache sits at the crown and feels like someone is ripping her skin apart. Occasionally it is shooting up and her neck hurts. Having really bad neck pain and cricks in her neck. It will shoot to forehead and temples, overall it sits in the crown of the head. She does weights. She works out a lot. When her headache flairs up she is slightly sensitive to light, she has been nauseated with the dizziness, dizziness is more like being on a boat, queasiness and dizziness in the last 2 weeks. It was after starting prozac. It has been up and down. Ongoing off and on. No other focal neurologic deficits, associated symptoms, inciting events or modifiable factors.  Reviewed notes, labs and imaging from outside physicians, which showed:  09/27/2021: showed No acute intracranial abnormalities including mass lesion or mass effect, hydrocephalus, extra-axial fluid collection, midline shift, hemorrhage, or acute infarction, large ischemic events (personally reviewed images)  09/27/2021: CMP was unremarkable. CBC showed elevated  WBCs and neutrophils otherwise normal.08/28/2021 TSH nml.  Review of Systems: Patient complains of symptoms per HPI as well as the following symptoms vertigo. Pertinent negatives and positives per HPI. All others negative.   Social History   Socioeconomic History   Marital status: Single    Spouse name: Not on file   Number of children: Not on file   Years of education: Not on file   Highest education level: Not on file  Occupational History   Not on file  Tobacco Use   Smoking status: Never   Smokeless tobacco: Never  Vaping Use   Vaping Use: Never used  Substance and Sexual Activity   Alcohol use: No   Drug use: No   Sexual activity: Not on file  Other Topics Concern   Not on file  Social History Narrative   Not on file   Social Determinants of Health   Financial Resource Strain: Not on file  Food Insecurity: Not on file  Transportation Needs: Not on file  Physical Activity: Not on file  Stress: Not on file  Social Connections: Not on file  Intimate Partner Violence: Not on file    Family History  Problem Relation Age of Onset   Other Father        Prurigo Nodularis   Migraines Maternal Aunt    Cancer Paternal Grandfather        Died at 50   Migraines Other    Hypertension Other    Diabetes Other     Past Medical  History:  Diagnosis Date   Migraine    Seizures (HCC)     Patient Active Problem List   Diagnosis Date Noted   Bilateral occipital neuralgia 10/06/2021   Neck pain 10/06/2021   Cervical myofascial pain syndrome 10/06/2021   Migraine with aura and without status migrainosus, not intractable 02/16/2014   Migraine variant 02/16/2014   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 90 tablet 3   Fluoxetine HCl, PMDD, 10 MG TABS Take 5 mg by mouth once.     hydrOXYzine (ATARAX/VISTARIL) 10 MG tablet Take 10-20 mg by mouth as needed.     ibuprofen (ADVIL,MOTRIN) 800 MG tablet Take 1 tablet (800 mg total) by mouth every 8 (eight) hours as needed. (Patient taking differently: Take 800 mg by mouth as needed.) 21 tablet 0   ondansetron (ZOFRAN-ODT) 4 MG disintegrating tablet Take 1-2 tablets (4-8 mg total) by mouth every 8 (eight) hours as needed. 30 tablet 3   rizatriptan (MAXALT-MLT) 10 MG disintegrating tablet Take 1 tablet (10 mg total) by mouth as needed for migraine. May repeat in 2 hours if needed 9 tablet 11   No current facility-administered medications for this visit.    Allergies as of 10/03/2021   (No Known Allergies)    Vitals: BP 114/65   Pulse 86   Ht 5\' 4"  (1.626 m)   Wt 124 lb 12.8 oz (56.6 kg)   LMP 09/17/2021   BMI 21.42 kg/m  Last Weight:  Wt Readings from Last 1 Encounters:  10/03/21 124 lb 12.8 oz (56.6 kg)   Last Height:   Ht Readings from Last 1 Encounters:  10/03/21 5\' 4"  (1.626 m)     Physical exam: Exam: Gen: NAD, conversant, well nourised, well groomed                     CV: RRR, no MRG. No Carotid Bruits. No peripheral edema, warm, nontender Eyes: Conjunctivae clear without exudates or hemorrhage  Neuro: Detailed Neurologic Exam  Speech:    Speech is normal; fluent and spontaneous with normal comprehension.  Cognition:    The patient is oriented to person, place, and time;     recent and remote memory intact;     language fluent;     normal attention, concentration,     fund of knowledge Cranial Nerves:    The pupils are equal, round, and reactive to light. The fundi are normal and spontaneous venous pulsations are present. Visual fields are full to finger confrontation. Extraocular movements are intact. Trigeminal  sensation is intact and the muscles of mastication are normal. The face is symmetric. The palate elevates in the midline. Hearing intact. Voice is normal. Shoulder shrug is normal. The tongue has normal motion without fasciculations.   Coordination:    Normal finger to nose and heel to shin. Normal rapid alternating movements.   Gait:    Heel-toe and tandem gait are normal.   Motor Observation:    No asymmetry, no atrophy, and no involuntary movements noted. Tone:    Normal muscle tone.    Posture:    Posture is normal. normal erect  Strength:    Strength is V/V in the upper and lower limbs.      Sensation: intact to LT     Reflex Exam:  DTR's:    Deep tendon reflexes in the upper and lower extremities are normal bilaterally.   Toes:    The toes are downgoing bilaterally.   Clonus:    Clonus is absent.    Assessment/Plan:  Patient here today with a hx of migraine with aura and currently with occipital neuralgia and neck pain. We had an extended visit discussing migraines, migraine aura, occipital nerve, occipital nerve blocks, we reviewed the anatomy of occipital nerve and watched a short video online of blocks. Patient sleeps on her stomach which put her neck at an awkward position all night which can cause stretch injury to the occipital nerve. CT head was normal. I offered nerve blocks, she was getting a massage later in the day so offered to have her come back another time. We discussed physical therapy, conservative measures. Gave her literature on migraines and aura. Also discussed risk of stroke in women with migraine with aura, we do not recommend any estrogen birth control. Extended visit.   Nerve blocks - occipital nerve blocks, recommend in future as needed. We called her after her massage and she was feeling better, so held off having her back for nerve block as same day.  Rizatriptan: Please take one tablet at the onset of your headache. If it does not improve the  symptoms please take one additional tablet. Do not take more then 2 tablets in 24hrs. Do not take use more then 2 to 3 times in a week. Can take with Ondansetron. Ondansetron: Take with Rizatripran can just take for nausea Karin Golden - [email or web address] - dry needling Flexeril 5mg  - 10mg  at bedtime "Occipital nerve blcoks Blumfeld" - watch on you tube Physical therapy - [facility]: Please send to [facility] medical center (Patient is a at [facility]). Physical therapy for neck pain, occipital neuralgia, cervical myofascial pain, include dry needling or any other modality per evaluation. Evaluate and treat.   Orders Placed This Encounter  Procedures   Ambulatory referral to Physical Therapy    Meds ordered this encounter  Medications   rizatriptan (MAXALT-MLT) 10 MG disintegrating tablet    Sig: Take 1 tablet (10 mg total) by mouth as needed for migraine. May repeat in 2 hours if needed    Dispense:  9 tablet    Refill:  11   ondansetron (ZOFRAN-ODT) 4 MG disintegrating tablet    Sig: Take 1-2 tablets (4-8 mg total) by mouth every 8 (eight) hours as needed.    Dispense:  30 tablet    Refill:  3   cyclobenzaprine (FLEXERIL) 10 MG tablet    Sig: Take 0.5-1 tablets (5-10 mg total) by mouth 3 (three) times daily as needed for muscle spasms.    Dispense:  90 tablet    Refill:  3   Discussed: To prevent or relieve headaches, try the following: Cool Compress. Lie down and place a cool compress on your head.  Avoid headache triggers. If certain foods or odors seem to have triggered your migraines in the past, avoid them. A headache diary might help you identify triggers.  Include physical activity in your daily routine. Try a daily walk or other moderate aerobic exercise.  Manage stress. Find healthy ways to cope with the stressors, such as delegating tasks on your to-do list.  Practice relaxation  techniques. Try deep breathing, yoga, massage and visualization.  Eat regularly.  Eating regularly scheduled meals and maintaining a healthy diet might help prevent headaches. Also, drink plenty of fluids.  Follow a regular sleep schedule. Sleep deprivation might contribute to headaches Consider biofeedback. With this mind-body technique, you learn to control certain bodily functions -- such as muscle tension, heart rate and blood pressure -- to prevent headaches or reduce headache pain.    Proceed to emergency room if you experience new or worsening symptoms or symptoms do not resolve, if you have new neurologic symptoms or if headache is severe, or for any concerning symptom.   Provided education and documentation from American headache Society toolbox including articles on: chronic migraine medication overuse headache, chronic migraines, prevention of migraines, behavioral and other nonpharmacologic treatments for headache.   Cc: Madolyn Frieze,  Adolph Pollack, FNP  Naomie Dean, MD  [facility] [address] [place], [place] [address]  Phone [phone] Fax [phone]  I spent over 90 minutes of face-to-face and non-face-to-face time with patient on the  1. Bilateral occipital neuralgia   2. Neck pain   3. Migraine with aura and without status migrainosus, not intractable   4. Cervical myofascial pain syndrome    diagnosis.  This included previsit chart review, lab review, study review, order entry, electronic health record documentation, patient education on the different diagnostic and therapeutic options, counseling and coordination of care, risks and benefits of management, compliance, or risk factor reduction

## 2021-10-03 NOTE — Telephone Encounter (Signed)
Spoke with Dr Lucia Gaskins who recommends at this time to watch and wait, use heat, massage, flexeril PRN.   Spoke with patient who agrees with plan recommended by Dr Lucia Gaskins. She will give [name] a call if needed. Pt verbalized appreciation.

## 2021-10-06 ENCOUNTER — Encounter: Payer: Self-pay | Admitting: Neurology

## 2021-10-06 DIAGNOSIS — M7918 Myalgia, other site: Secondary | ICD-10-CM | POA: Insufficient documentation

## 2021-10-06 DIAGNOSIS — M542 Cervicalgia: Secondary | ICD-10-CM | POA: Insufficient documentation

## 2021-10-06 DIAGNOSIS — M5481 Occipital neuralgia: Secondary | ICD-10-CM | POA: Insufficient documentation

## 2021-10-08 ENCOUNTER — Telehealth: Payer: Self-pay | Admitting: Neurology

## 2021-10-08 NOTE — Telephone Encounter (Signed)
PT referral sent to [facility] at the [facility]. Phone: [phone]. Located in [place].

## 2021-10-09 ENCOUNTER — Ambulatory Visit: Payer: 59 | Admitting: Psychiatry

## 2021-10-16 ENCOUNTER — Ambulatory Visit (INDEPENDENT_AMBULATORY_CARE_PROVIDER_SITE_OTHER): Payer: 59 | Admitting: Neurology

## 2021-10-16 ENCOUNTER — Other Ambulatory Visit: Payer: Self-pay

## 2021-10-16 DIAGNOSIS — R519 Headache, unspecified: Secondary | ICD-10-CM

## 2021-10-16 DIAGNOSIS — G43109 Migraine with aura, not intractable, without status migrainosus: Secondary | ICD-10-CM | POA: Diagnosis not present

## 2021-10-16 DIAGNOSIS — M542 Cervicalgia: Secondary | ICD-10-CM

## 2021-10-16 NOTE — Progress Notes (Signed)
Nerve block (w/o) steroid: Pt signed consent yes  0.5% Bupivocaine 75mL LOT: [id number] EXP: 04/15/2023 NDC::0409-1162-19

## 2021-10-16 NOTE — Progress Notes (Signed)
  Patient here today with mother with occipital headache and neck pain.  I spent 30 minutes of face-to-face and non-face-to-face time with patient on the  1. Occipital headache   2. Migraine with aura and without status migrainosus, not intractable   3. Neck pain    diagnosis.  This included previsit chart review, lab review, study review, order entry, electronic health record documentation, patient education on the different diagnostic and therapeutic options, counseling and coordination of care, risks and benefits of management, compliance, or risk factor reduction    Performed by Dr. Lucia Gaskins M.D. All procedures a documented blood were medically necessary, reasonable and appropriate based on the patient's history, medical diagnosis and physician opinion. Verbal informed consent was obtained from the patient, patient was informed of potential risk of procedure, including bruising, bleeding, hematoma formation, infection, muscle weakness, muscle pain, numbness, transient hypertension, transient hyperglycemia and transient insomnia among others. All areas injected were topically clean with isopropyl rubbing alcohol. Nonsterile nonlatex gloves were worn during the procedure.  1. Greater occipital nerve block [phone]). The greater occipital nerve site was identified at the nuchal line medial to the occipital artery. Medication was injected into the left and right occipital nerve areas and suboccipital areas. Patient's condition is associated with inflammation of the greater occipital nerve and associated multiple groups. Injection was deemed medically necessary, reasonable and appropriate. Injection represents a separate and unique surgical service.  2. Lesser occipital nerve block [phone]). The lesser occipital nerve site was identified approximately 2 cm lateral to the greater occipital nerve. Occasion was injected into the left and right occipital nerve areas. Patient's condition is associated with  inflammation of the lesser occipital nerve and associated muscle groups. Injection was deemed medically necessary, reasonable and appropriate. Injection represents a separate and unique surgical service.   3. Auriculotemporal nerve block ([id number]): The Auriculotemporal nerve site was identified along the posterior margin of the sternocleidomastoid muscle toward the base of the ear. Medication was injected into the left and right radicular temporal nerve areas. Patient's condition is associated with inflammation of the Auriculotemporal Nerve and associated muscle groups. Injection was deemed medically necessary, reasonable and appropriate. Injection represents a separate and unique surgical service.   I spent 30 minutes of face-to-face and non-face-to-face time with patient on the  1. Occipital headache   2. Migraine with aura and without status migrainosus, not intractable   3. Neck pain    diagnosis.  This included previsit chart review, lab review, study review, order entry, electronic health record documentation, patient education on the different diagnostic and therapeutic options, counseling and coordination of care, risks and benefits of management, compliance, or risk factor reduction

## 2022-11-30 IMAGING — CT CT HEAD W/O CM
3 series · 16 of 47 positions shown, 19 images · non-contrast
Comparison: Report from head CT March 04, 2008 however no
corresponding imaging available at time dictation.

CLINICAL DATA: Headache and dizziness.

EXAM:
CT HEAD WITHOUT CONTRAST
TECHNIQUE: Contiguous axial images were obtained from the base of the skull
through the vertex without intravenous contrast.

[Series 2: head wo · axial · 0.42mm/px · z∈[-400,-275]mm · 10 of 31 slices shown, 13 images]
[im 3/31  brain]
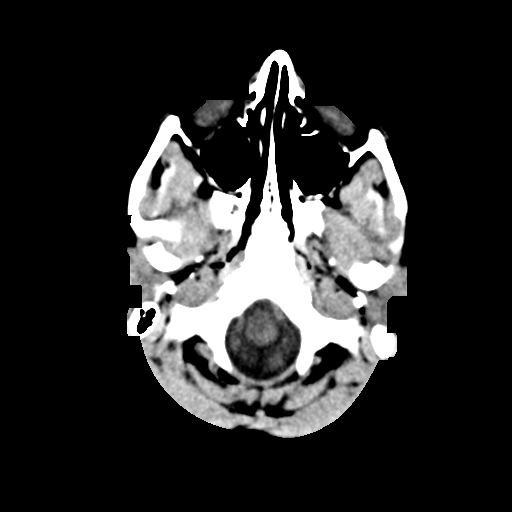
[im 3/31  bone]
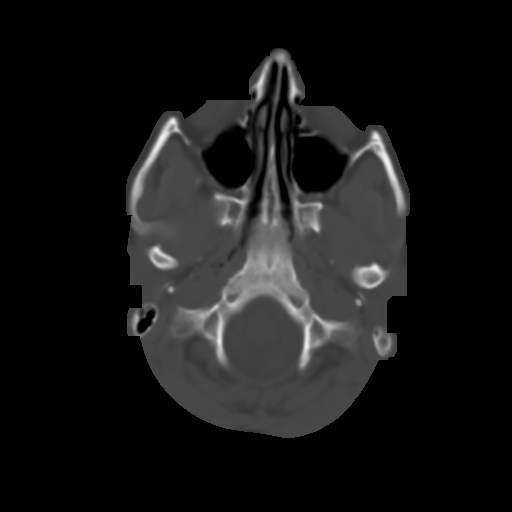
[im 6/31  brain]
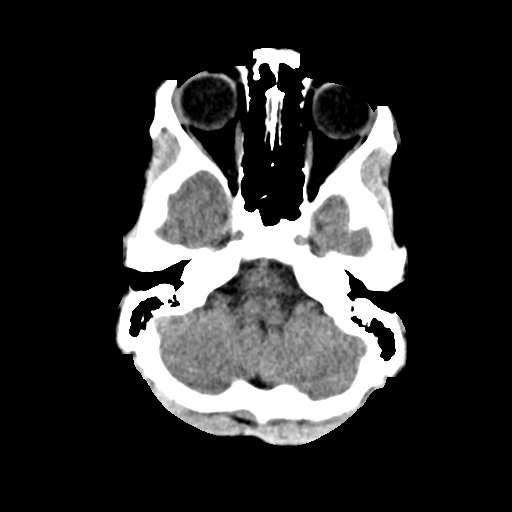
[im 9/31  brain]
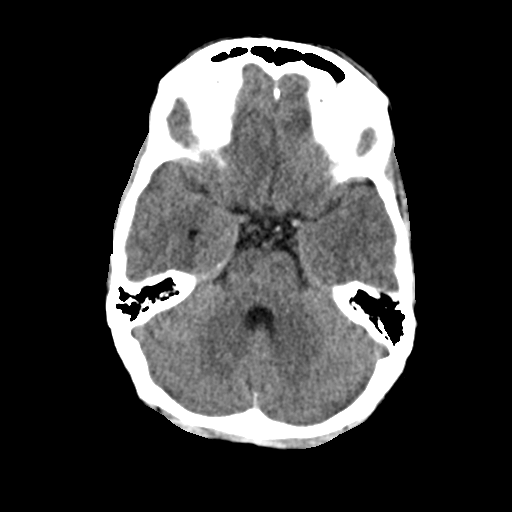
[im 11/31  brain]
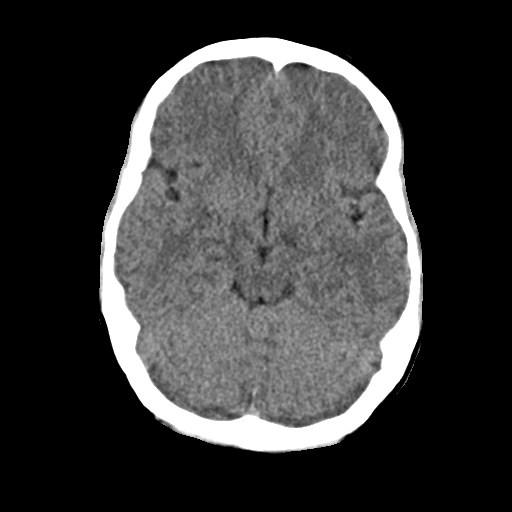
[im 14/31  brain]
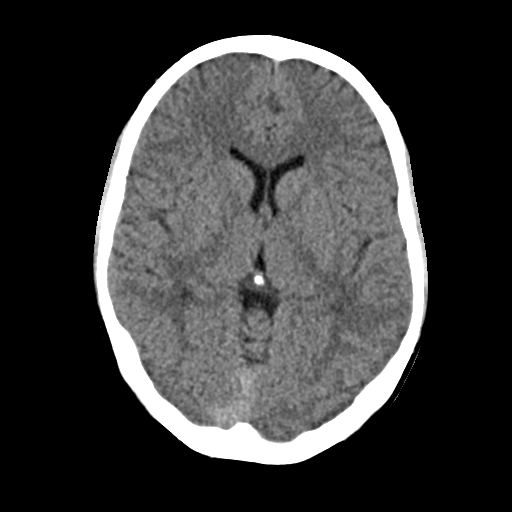
[im 14/31  bone]
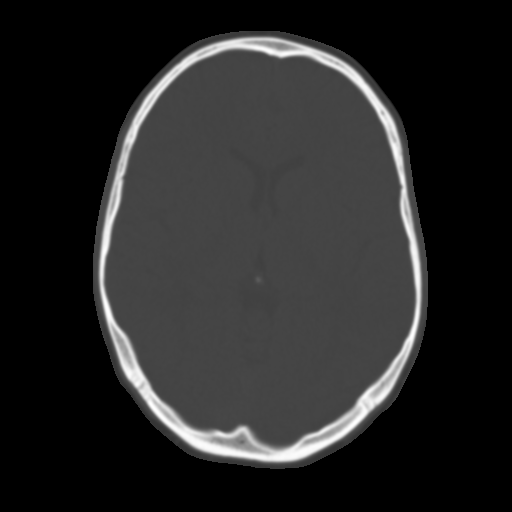
[im 17/31  brain]
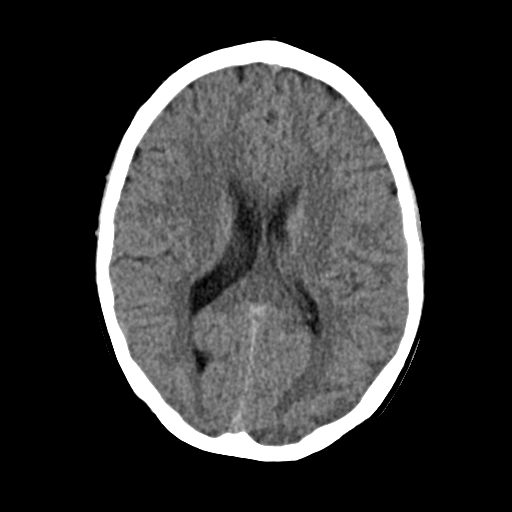
[im 20/31  brain]
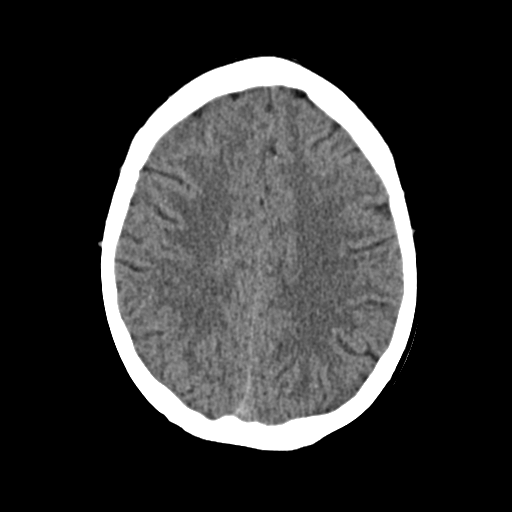
[im 23/31  brain]
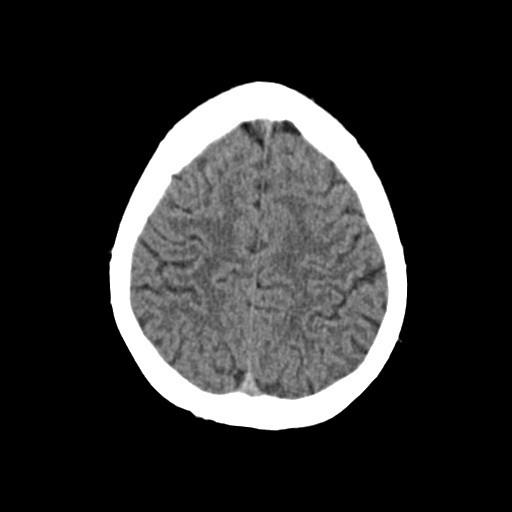
[im 25/31  brain]
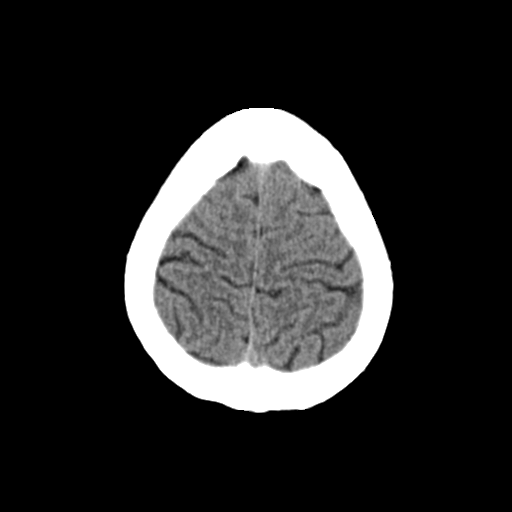
[im 25/31  bone]
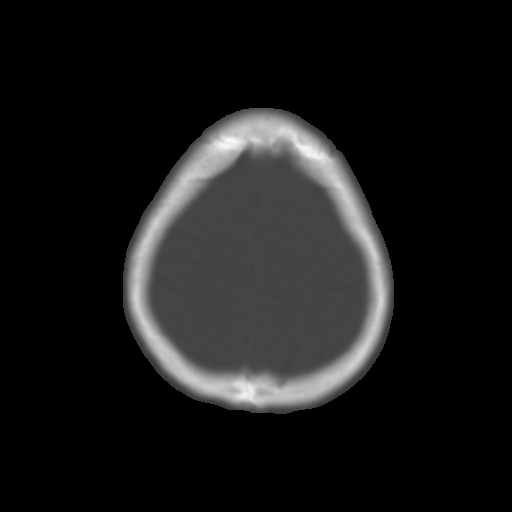
[im 28/31  brain]
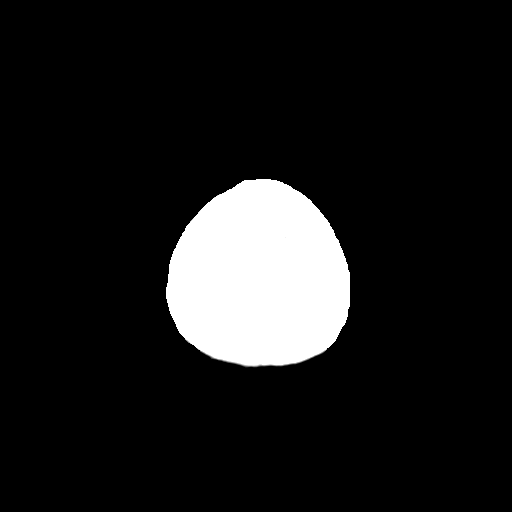

[Series 4: coronal soft · coronal · 0.32mm/px · 3 of 66 slices shown]
[im 22/66  brain]
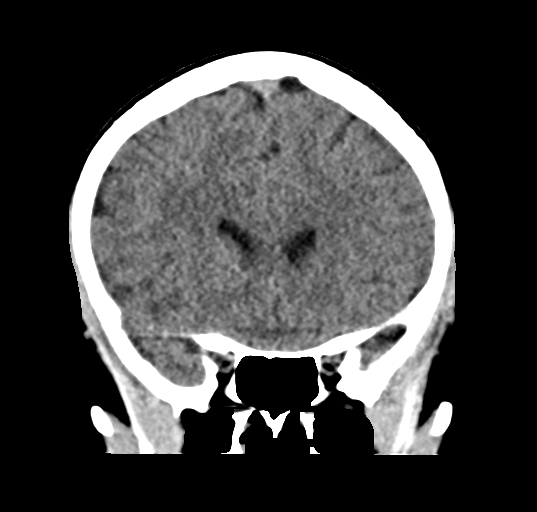
[im 29/66  brain]
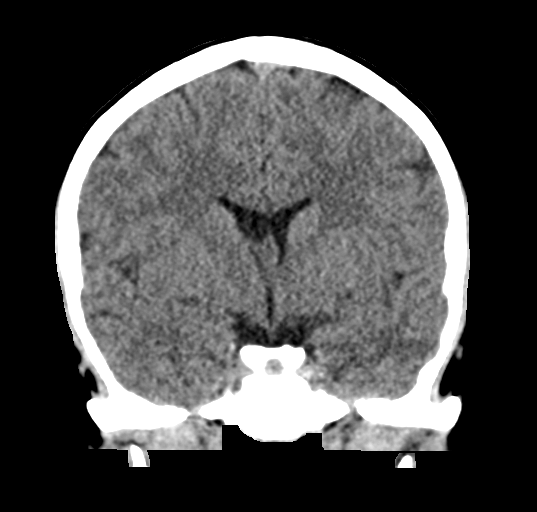
[im 37/66  brain]
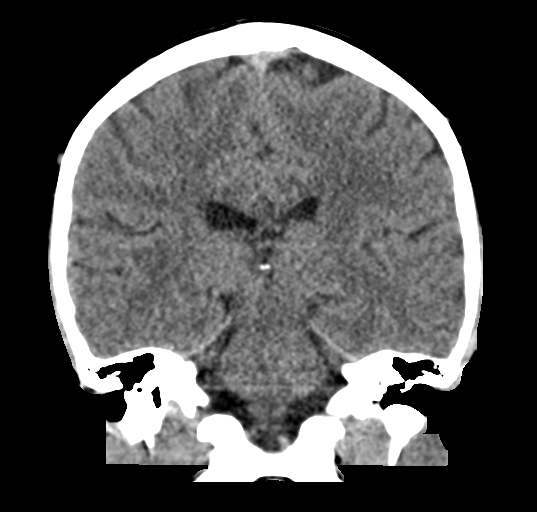

[Series 5: sag soft · sagittal · 0.32mm/px · 3 of 58 slices shown]
[im 20/58  brain]
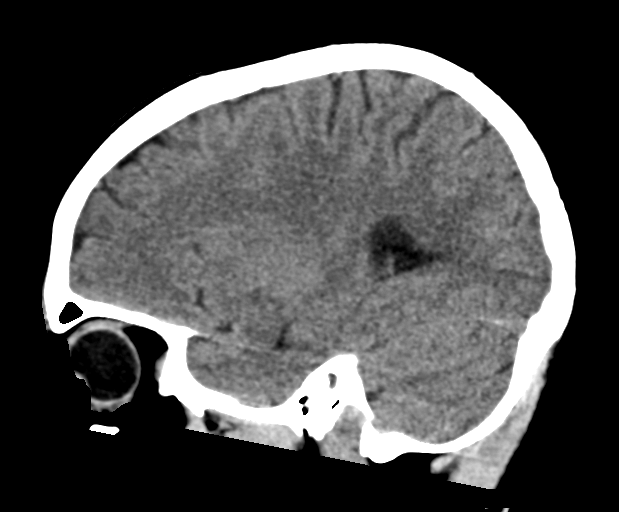
[im 29/58  brain]
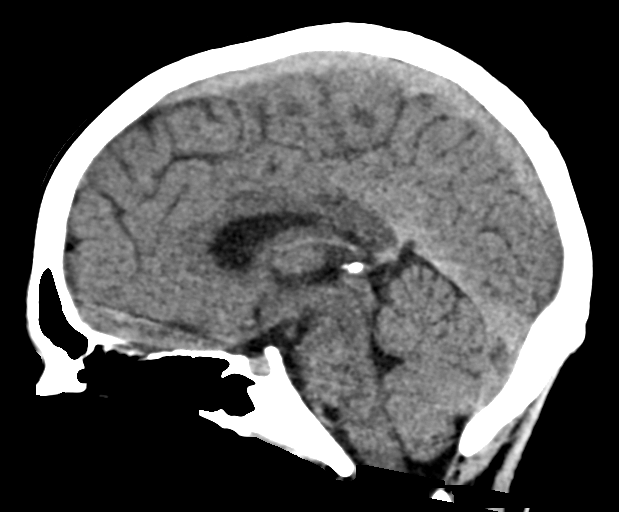
[im 39/58  brain]
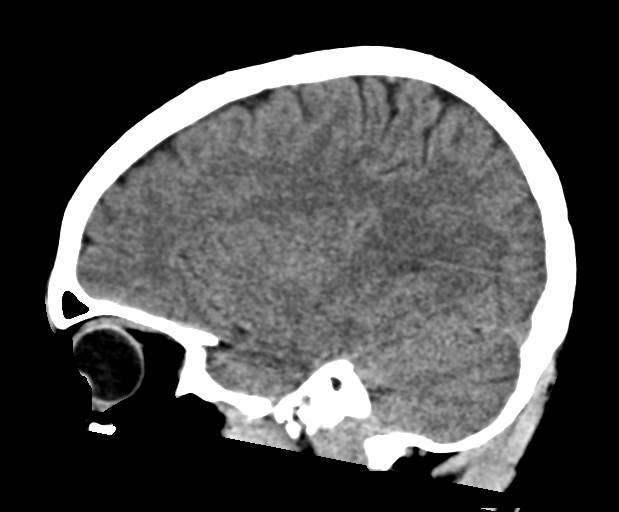

[16 of 47 positions shown; findings below may reference images not displayed]

FINDINGS: Brain: No evidence of acute infarction, hemorrhage, hydrocephalus,
extra-axial collection or mass lesion/mass effect.

Vascular: No hyperdense vessel or unexpected calcification.

Skull: Normal. Negative for fracture or focal lesion.

Sinuses/Orbits: Visualized portions of the paranasal sinuses and
mastoid air cells are predominantly clear.

Other: None
IMPRESSION: No acute intracranial findings.
